# Patient Record
Sex: Male | Born: 1970 | Race: White | Hispanic: No | Marital: Single | Smoking: Current every day smoker
Health system: Southern US, Community
[De-identification: ages and names within clinical notes are randomized; demographics above are authoritative.]

---

## 2003-06-16 ENCOUNTER — Inpatient Hospital Stay (HOSPITAL_COMMUNITY): Admission: AC | Admit: 2003-06-16 | Discharge: 2003-06-26 | Payer: Self-pay

## 2003-08-24 ENCOUNTER — Ambulatory Visit (HOSPITAL_COMMUNITY): Admission: RE | Admit: 2003-08-24 | Discharge: 2003-08-24 | Payer: Self-pay | Admitting: Oral Surgery

## 2004-05-02 ENCOUNTER — Emergency Department (HOSPITAL_COMMUNITY): Admission: EM | Admit: 2004-05-02 | Discharge: 2004-05-02 | Payer: Self-pay | Admitting: Emergency Medicine
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
## 2005-09-19 ENCOUNTER — Emergency Department (HOSPITAL_COMMUNITY): Admission: EM | Admit: 2005-09-19 | Discharge: 2005-09-19 | Payer: Self-pay | Admitting: Emergency Medicine

## 2018-07-29 ENCOUNTER — Other Ambulatory Visit: Payer: Self-pay

## 2018-07-29 ENCOUNTER — Encounter (HOSPITAL_COMMUNITY): Payer: Self-pay

## 2018-07-29 ENCOUNTER — Ambulatory Visit (INDEPENDENT_AMBULATORY_CARE_PROVIDER_SITE_OTHER): Payer: Self-pay

## 2018-07-29 ENCOUNTER — Ambulatory Visit (HOSPITAL_COMMUNITY)
Admission: EM | Admit: 2018-07-29 | Discharge: 2018-07-29 | Disposition: A | Discharge status: Home / Self Care | Payer: Self-pay | Attending: Emergency Medicine | Admitting: Emergency Medicine

## 2018-07-29 DIAGNOSIS — S61212A Laceration without foreign body of right middle finger without damage to nail, initial encounter: Secondary | ICD-10-CM

## 2018-07-29 DIAGNOSIS — W28XXXA Contact with powered lawn mower, initial encounter: Secondary | ICD-10-CM

## 2018-07-29 DIAGNOSIS — S61214A Laceration without foreign body of right ring finger without damage to nail, initial encounter: Secondary | ICD-10-CM

## 2018-07-29 DIAGNOSIS — S61210A Laceration without foreign body of right index finger without damage to nail, initial encounter: Secondary | ICD-10-CM

## 2018-07-29 DIAGNOSIS — Z23 Encounter for immunization: Secondary | ICD-10-CM

## 2018-07-29 MED ORDER — POVIDONE-IODINE 10 % EX SOLN
CUTANEOUS | Status: AC
  Filled 2018-07-29: qty 118

## 2018-07-29 MED ORDER — IBUPROFEN 600 MG PO TABS: 600.0000 mg | ORAL_TABLET | Freq: Four times a day (QID) | ORAL | 0 refills | Status: AC | PRN

## 2018-07-29 MED ORDER — LIDOCAINE HCL 2 % IJ SOLN
INTRAMUSCULAR | Status: AC
  Filled 2018-07-29: qty 20

## 2018-07-29 MED ORDER — HYDROCODONE-ACETAMINOPHEN 5-325 MG PO TABS: 1.0000 | ORAL_TABLET | Freq: Four times a day (QID) | ORAL | 0 refills | Status: AC | PRN

## 2018-07-29 MED ORDER — TETANUS-DIPHTH-ACELL PERTUSSIS 5-2.5-18.5 LF-MCG/0.5 IM SUSP
INTRAMUSCULAR | Status: AC
  Filled 2018-07-29: qty 0.5

## 2018-07-29 MED ORDER — CEPHALEXIN 500 MG PO CAPS: 1000.0000 mg | ORAL_CAPSULE | Freq: Two times a day (BID) | ORAL | 0 refills | Status: AC

## 2018-07-29 MED ORDER — TETANUS-DIPHTH-ACELL PERTUSSIS 5-2.5-18.5 LF-MCG/0.5 IM SUSP
0.5000 mL | Freq: Once | INTRAMUSCULAR | Status: AC
  Administered 2018-07-29: 0.5 mL via INTRAMUSCULAR

## 2018-07-29 NOTE — ED Triage Notes (Signed)
Pt states he cut his fingers on a lawn mower today.  Pt states the lawn mower was falling off of the truck and he went to try and catch it and he he cut 3 fingers.

## 2018-07-29 NOTE — ED Provider Notes (Addendum)
HPI  SUBJECTIVE:  Joshua Wilkins is a right-handed 48 y.o. male who presents with lacerations on his distal right index, middle and ring finger sustained earlier today.  Patient states that he tried to catch a lawnmower and it slammed his hand up against the tailgate.  He denies numbness, tingling, foreign body sensation.  No limitation of movement.  He reports constant throbbing pain.  He tried rinsing the wounds out with water without improvement of symptoms.  No aggravating factors.  He is a smoker.  No history of diabetes.  His tetanus is not up-to-date.  PMD: None.  History reviewed. No pertinent past medical history.  History reviewed. No pertinent surgical history.  History reviewed. No pertinent family history.  Social History   Tobacco Use  . Smoking status: Current Every Day Smoker  . Smokeless tobacco: Never Used  Substance Use Topics  . Alcohol use: Yes  . Drug use: Never    No current facility-administered medications for this encounter.   Current Outpatient Medications:  .  cephALEXin (KEFLEX) 500 MG capsule, Take 2 capsules (1,000 mg total) by mouth 2 (two) times daily for 5 days., Disp: 20 capsule, Rfl: 0 .  HYDROcodone-acetaminophen (NORCO/VICODIN) 5-325 MG tablet, Take 1-2 tablets by mouth every 6 (six) hours as needed for moderate pain or severe pain., Disp: 12 tablet, Rfl: 0 .  ibuprofen (ADVIL) 600 MG tablet, Take 1 tablet (600 mg total) by mouth every 6 (six) hours as needed., Disp: 30 tablet, Rfl: 0  No Known Allergies   ROS  As noted in HPI.   Physical Exam  BP (!) 148/80 (BP Location: Right Arm)   Pulse 79   Temp 98 F (36.7 C)   Resp 18   Wt 72.6 kg   Constitutional: Well developed, well nourished, no acute distress Eyes:  EOMI, conjunctiva normal bilaterally HENT: Normocephalic, atraumatic,mucus membranes moist Respiratory: Normal inspiratory effort Cardiovascular: Normal rate GI: nondistended skin: No rash, skin intact Musculoskeletal:   Right index finger.  1.5 cm linear laceration distal finger with bruising.  Two-point discrimination intact.  FDS/FDP intact.  Cap refill less than 2-second Right middle finger: 2 cm linear laceration at the DIP/distal phalanx. Two-point discrimination intact.  FDS/FDP intact.  Cap refill less than 2 seconds Right ring finger.  1.5 cm linear laceration distal phalanx. Two-point discrimination intact.  FDS/FDP intact.  Cap refill less than 2 seconds     Neurologic: Alert & oriented x 3, no focal neuro deficits Psychiatric: Speech and behavior appropriate   ED Course   Medications  Tdap (BOOSTRIX) injection 0.5 mL (0.5 mLs Intramuscular Given 07/29/18 1308)  povidone-iodine (BETADINE) 10 % external solution (has no administration in time range)  lidocaine (XYLOCAINE) 2 % (with pres) injection (has no administration in time range)  Tdap (BOOSTRIX) 5-2.5-18.5 LF-MCG/0.5 injection (has no administration in time range)    Orders Placed This Encounter  Procedures  . DG Hand Complete Right    Standing Status:   Standing    Number of Occurrences:   1    Order Specific Question:   Reason for Exam (SYMPTOM  OR DIAGNOSIS REQUIRED)    Answer:   laceration index middle ring finger r/.o FB, phalangeal fx  . Apply finger splint static    Middle finger    Standing Status:   Standing    Number of Occurrences:   1    Order Specific Question:   Laterality    Answer:   Right    No  results found for this or any previous visit (from the past 24 hour(s)). Dg Hand Complete Right  Result Date: 07/29/2018 CLINICAL DATA:  Smashed finger between lumbar and car, lacerations to distal aspects of RIGHT index, middle, and ring fingers EXAM: RIGHT HAND - COMPLETE 3+ VIEW COMPARISON:  None FINDINGS: Soft tissue lucency corresponding to lacerations identified at the volar aspects of the distal RIGHT index, middle, and ring fingers. Osseous mineralization normal. Joint spaces preserved. No acute fracture,  dislocation, or bone destruction. No radiopaque foreign bodies identified. IMPRESSION: No acute osseous abnormalities. Electronically Signed   By: Lavonia Dana M.D.   On: 07/29/2018 13:38    ED Clinical Impression  1. Laceration of right index finger without foreign body without damage to nail, initial encounter   2. Laceration of right middle finger without foreign body without damage to nail, initial encounter   3. Laceration of right ring finger without foreign body without damage to nail, initial encounter      ED Assessment/Plan  [place] [facility] database reviewed for this patient, and feel that the risk/benefit ratio today is favorable for proceeding with a prescription for controlled substance.  No opiate prescriptions in 2 years.  Patient declined pain medication here. Updating tetanus. Checking x-ray due to the extensive bruising of his index finger to rule out open fracture.  Reviewed imaging independently.  No fracture, foreign body.  See radiology report for details.  Procedure note: Wounds soaked in soap, water, iodine. Performed a digital block with 5 cc total 2% plain lidocaine with adequate anesthesia. then irrigated with wound wash solution. The wound was then explored with adequate hemostasis.  No foreign body, tendon, neurovascular involvement noted.  Index finger four 5-0 interrupted Prolene sutures with close approximation of wound edges Middle finger six 5-0 interrupted Prolene sutures with close approximation of wound edges.  Splint placed after bacitracin and dressing. Ring finger.  Five 5-0 interrupted Prolene sutures with close approximation of wound edges.  Bacitracin and dressing placed on all lacerations.  Patient tolerated procedure well.  Home with ibuprofen/Tylenol, 5 days of Keflex.  Patient initially declined pain medication but called later requesting some.  Sent in 12 of Norco to pharmacy of patient's choice.  Return here in 10 days for suture removal, sooner  for any signs of infection.  Discussed  imaging, MDM, treatment plan, and plan for follow-up with patient. Discussed sn/sx that should prompt return to the ED. patient agrees with plan.   Meds ordered this encounter  Medications  . Tdap (BOOSTRIX) injection 0.5 mL  . ibuprofen (ADVIL) 600 MG tablet    Sig: Take 1 tablet (600 mg total) by mouth every 6 (six) hours as needed.    Dispense:  30 tablet    Refill:  0  . cephALEXin (KEFLEX) 500 MG capsule    Sig: Take 2 capsules (1,000 mg total) by mouth 2 (two) times daily for 5 days.    Dispense:  20 capsule    Refill:  0  . HYDROcodone-acetaminophen (NORCO/VICODIN) 5-325 MG tablet    Sig: Take 1-2 tablets by mouth every 6 (six) hours as needed for moderate pain or severe pain.    Dispense:  12 tablet    Refill:  0    *This clinic note was created using [employer]. Therefore, there may be occasional mistakes despite careful proofreading.   ?   Melynda Ripple, MD 07/29/18 1626    Melynda Ripple, MD 07/29/18 2122

## 2018-07-29 NOTE — Discharge Instructions (Signed)
Take 600 mg ibuprofen combined with 1 g of Tylenol 3-4 times a day as needed for pain.  Keep this clean and dry for the next 72 hours.  Then you can take the bandage off and let it air dry.  Keep it covered while you are using your hand.  Return here in 10 days for suture removal, sooner for any signs of infection.

## 2018-08-08 ENCOUNTER — Ambulatory Visit (HOSPITAL_COMMUNITY)
Admission: EM | Admit: 2018-08-08 | Discharge: 2018-08-08 | Disposition: A | Discharge status: Home / Self Care | Payer: Self-pay

## 2018-08-08 ENCOUNTER — Other Ambulatory Visit: Payer: Self-pay

## 2018-08-08 ENCOUNTER — Encounter (HOSPITAL_COMMUNITY): Payer: Self-pay

## 2018-08-08 DIAGNOSIS — Z4802 Encounter for removal of sutures: Secondary | ICD-10-CM

## 2018-08-08 NOTE — ED Triage Notes (Signed)
Pt presents today for suture removal from three fingers on right hand. 15 sutures removed, wounds healing well, no bleeding noted. Pt verbalizes understanding of wound care.

## 2019-09-03 ENCOUNTER — Ambulatory Visit: Payer: Self-pay

## 2022-04-15 ENCOUNTER — Emergency Department (HOSPITAL_COMMUNITY)
Admission: EM | Admit: 2022-04-15 | Discharge: 2022-04-15 | Disposition: A | Discharge status: Other Acute Inpatient Hospital | Payer: Medicaid Other | Attending: Emergency Medicine | Admitting: Emergency Medicine

## 2022-04-15 ENCOUNTER — Emergency Department (HOSPITAL_COMMUNITY): Payer: Medicaid Other

## 2022-04-15 ENCOUNTER — Encounter (HOSPITAL_COMMUNITY): Payer: Self-pay | Admitting: Emergency Medicine

## 2022-04-15 ENCOUNTER — Other Ambulatory Visit: Payer: Self-pay

## 2022-04-15 DIAGNOSIS — S0183XA Puncture wound without foreign body of other part of head, initial encounter: Secondary | ICD-10-CM

## 2022-04-15 DIAGNOSIS — T1491XA Suicide attempt, initial encounter: Secondary | ICD-10-CM

## 2022-04-15 DIAGNOSIS — S02600B Fracture of unspecified part of body of mandible, initial encounter for open fracture: Secondary | ICD-10-CM | POA: Insufficient documentation

## 2022-04-15 DIAGNOSIS — H748X2 Other specified disorders of left middle ear and mastoid: Secondary | ICD-10-CM | POA: Diagnosis not present

## 2022-04-15 DIAGNOSIS — F17210 Nicotine dependence, cigarettes, uncomplicated: Secondary | ICD-10-CM | POA: Diagnosis not present

## 2022-04-15 DIAGNOSIS — E876 Hypokalemia: Secondary | ICD-10-CM | POA: Insufficient documentation

## 2022-04-15 DIAGNOSIS — S01439A Puncture wound without foreign body of unspecified cheek and temporomandibular area, initial encounter: Secondary | ICD-10-CM | POA: Diagnosis not present

## 2022-04-15 DIAGNOSIS — X749XXA Intentional self-harm by unspecified firearm discharge, initial encounter: Secondary | ICD-10-CM | POA: Diagnosis not present

## 2022-04-15 LAB — CBC
HCT: 28.6 % — ABNORMAL LOW (ref 39.0–52.0)
HCT: 28.1 % — ABNORMAL LOW (ref 39.0–52.0)
Hemoglobin: 9.7 g/dL — ABNORMAL LOW (ref 13.0–17.0)
Hemoglobin: 9.6 g/dL — ABNORMAL LOW (ref 13.0–17.0)
MCH: 32.3 pg (ref 26.0–34.0)
MCH: 32.3 pg (ref 26.0–34.0)
MCHC: 33.9 g/dL (ref 30.0–36.0)
MCHC: 34.2 g/dL (ref 30.0–36.0)
MCV: 95.3 fL (ref 80.0–100.0)
MCV: 94.6 fL (ref 80.0–100.0)
Platelets: 377 10*3/uL (ref 150–400)
Platelets: 259 10*3/uL (ref 150–400)
RBC: 3 MIL/uL — ABNORMAL LOW (ref 4.22–5.81)
RBC: 2.97 MIL/uL — ABNORMAL LOW (ref 4.22–5.81)
RDW: 13 % (ref 11.5–15.5)
RDW: 13 % (ref 11.5–15.5)
WBC: 19.9 10*3/uL — ABNORMAL HIGH (ref 4.0–10.5)
WBC: 20.2 10*3/uL — ABNORMAL HIGH (ref 4.0–10.5)
nRBC: 0 % (ref 0.0–0.2)
nRBC: 0 % (ref 0.0–0.2)

## 2022-04-15 LAB — COMPREHENSIVE METABOLIC PANEL
ALT: 37 U/L (ref 0–44)
AST: 44 U/L — ABNORMAL HIGH (ref 15–41)
Albumin: 3.8 g/dL (ref 3.5–5.0)
Alkaline Phosphatase: 69 U/L (ref 38–126)
Anion gap: 12 (ref 5–15)
BUN: 10 mg/dL (ref 6–20)
CO2: 24 mmol/L (ref 22–32)
Calcium: 8.6 mg/dL — ABNORMAL LOW (ref 8.9–10.3)
Chloride: 97 mmol/L — ABNORMAL LOW (ref 98–111)
Creatinine, Ser: 1.22 mg/dL (ref 0.61–1.24)
GFR, Estimated: >60 mL/min (ref >60)
Glucose, Bld: 179 mg/dL — ABNORMAL HIGH (ref 70–99)
Potassium: 3.3 mmol/L — ABNORMAL LOW (ref 3.5–5.1)
Sodium: 133 mmol/L — ABNORMAL LOW (ref 135–145)
Total Bilirubin: 0.9 mg/dL (ref 0.3–1.2)
Total Protein: 6.5 g/dL (ref 6.5–8.1)

## 2022-04-15 LAB — I-STAT CHEM 8, ED
BUN: 10 mg/dL (ref 6–20)
Calcium, Ion: 1.08 mmol/L — ABNORMAL LOW (ref 1.15–1.40)
Chloride: 97 mmol/L — ABNORMAL LOW (ref 98–111)
Creatinine, Ser: 1.1 mg/dL (ref 0.61–1.24)
Glucose, Bld: 183 mg/dL — ABNORMAL HIGH (ref 70–99)
HCT: 30 % — ABNORMAL LOW (ref 39.0–52.0)
Hemoglobin: 10.2 g/dL — ABNORMAL LOW (ref 13.0–17.0)
Potassium: 3.3 mmol/L — ABNORMAL LOW (ref 3.5–5.1)
Sodium: 135 mmol/L (ref 135–145)
TCO2: 23 mmol/L (ref 22–32)

## 2022-04-15 LAB — LACTIC ACID, PLASMA: Lactic Acid, Venous: 4.3 mmol/L (ref 0.5–1.9)

## 2022-04-15 LAB — SAMPLE TO BLOOD BANK

## 2022-04-15 LAB — PROTIME-INR
INR: 1.2 (ref 0.8–1.2)
Prothrombin Time: 14.7 seconds (ref 11.4–15.2)

## 2022-04-15 LAB — ETHANOL: Alcohol, Ethyl (B): <10 mg/dL (ref <10)

## 2022-04-15 MED ORDER — CEFAZOLIN SODIUM-DEXTROSE 2-4 GM/100ML-% IV SOLN
2.0000 g | INTRAVENOUS | Status: AC
  Administered 2022-04-15: 2 g via INTRAVENOUS

## 2022-04-15 MED ORDER — FENTANYL CITRATE PF 50 MCG/ML IJ SOSY
PREFILLED_SYRINGE | INTRAMUSCULAR | Status: AC
  Administered 2022-04-15: 25 ug
  Filled 2022-04-15: qty 1
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
## 2022-04-15 NOTE — ED Notes (Signed)
[facility] called for transport to [facility]  
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
## 2022-04-16 ENCOUNTER — Encounter (HOSPITAL_COMMUNITY): Payer: Self-pay

## 2022-07-03 ENCOUNTER — Emergency Department (HOSPITAL_BASED_OUTPATIENT_CLINIC_OR_DEPARTMENT_OTHER)
Admission: EM | Admit: 2022-07-03 | Discharge: 2022-07-03 | Disposition: A | Discharge status: Home / Self Care | Payer: Medicaid Other | Attending: Emergency Medicine | Admitting: Emergency Medicine

## 2022-07-03 ENCOUNTER — Emergency Department (HOSPITAL_BASED_OUTPATIENT_CLINIC_OR_DEPARTMENT_OTHER): Payer: Medicaid Other

## 2022-07-03 ENCOUNTER — Encounter (HOSPITAL_BASED_OUTPATIENT_CLINIC_OR_DEPARTMENT_OTHER): Payer: Self-pay

## 2022-07-03 ENCOUNTER — Other Ambulatory Visit: Payer: Self-pay

## 2022-07-03 DIAGNOSIS — K9423 Gastrostomy malfunction: Secondary | ICD-10-CM | POA: Insufficient documentation

## 2022-07-03 NOTE — ED Triage Notes (Signed)
Patient here POV from Home.  Notes a Leaking Feeding Tube. Has had this one present for 2 Months and noted to be leaking after cleaning less than an hour ago.  NAD Noted during Triage. A&Ox4. GCS 15. Ambulatory.

## 2022-07-03 NOTE — ED Notes (Signed)
XR at bedside
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
## 2022-07-03 NOTE — Discharge Instructions (Signed)
Your G-tube balloon was inflated and now it is not leaking  Your tubing will need to be replaced and you need to talk to your GI doctor about replacing the tubing  Follow-up with your doctor  Return to ER if you have worsening pain or G-tube leaking or coming out

## 2022-07-03 NOTE — ED Notes (Signed)
Reviewed AVS with patient, patient expressed understanding of directions, denies further questions at this time. 

## 2022-09-13 ENCOUNTER — Other Ambulatory Visit (HOSPITAL_BASED_OUTPATIENT_CLINIC_OR_DEPARTMENT_OTHER): Payer: Self-pay

## 2022-09-13 ENCOUNTER — Emergency Department (HOSPITAL_BASED_OUTPATIENT_CLINIC_OR_DEPARTMENT_OTHER): Payer: Medicaid Other

## 2022-09-13 ENCOUNTER — Encounter (HOSPITAL_BASED_OUTPATIENT_CLINIC_OR_DEPARTMENT_OTHER): Payer: Self-pay

## 2022-09-13 ENCOUNTER — Observation Stay (HOSPITAL_BASED_OUTPATIENT_CLINIC_OR_DEPARTMENT_OTHER)
Admission: EM | Admit: 2022-09-13 | Discharge: 2022-09-16 | Disposition: A | Discharge status: Home / Self Care | Payer: Medicaid Other | Attending: Internal Medicine | Admitting: Internal Medicine

## 2022-09-13 ENCOUNTER — Other Ambulatory Visit: Payer: Self-pay

## 2022-09-13 DIAGNOSIS — F17210 Nicotine dependence, cigarettes, uncomplicated: Secondary | ICD-10-CM | POA: Diagnosis present

## 2022-09-13 DIAGNOSIS — E860 Dehydration: Secondary | ICD-10-CM | POA: Diagnosis present

## 2022-09-13 DIAGNOSIS — I700 Atherosclerosis of aorta: Secondary | ICD-10-CM | POA: Diagnosis not present

## 2022-09-13 DIAGNOSIS — Z9151 Personal history of suicidal behavior: Secondary | ICD-10-CM | POA: Diagnosis not present

## 2022-09-13 DIAGNOSIS — Z1152 Encounter for screening for COVID-19: Secondary | ICD-10-CM

## 2022-09-13 DIAGNOSIS — D509 Iron deficiency anemia, unspecified: Secondary | ICD-10-CM | POA: Diagnosis not present

## 2022-09-13 DIAGNOSIS — Z931 Gastrostomy status: Secondary | ICD-10-CM | POA: Diagnosis not present

## 2022-09-13 DIAGNOSIS — N179 Acute kidney failure, unspecified: Secondary | ICD-10-CM | POA: Diagnosis not present

## 2022-09-13 DIAGNOSIS — K5900 Constipation, unspecified: Secondary | ICD-10-CM | POA: Diagnosis not present

## 2022-09-13 DIAGNOSIS — D72829 Elevated white blood cell count, unspecified: Secondary | ICD-10-CM | POA: Diagnosis not present

## 2022-09-13 DIAGNOSIS — R1110 Vomiting, unspecified: Secondary | ICD-10-CM

## 2022-09-13 DIAGNOSIS — D649 Anemia, unspecified: Secondary | ICD-10-CM

## 2022-09-13 DIAGNOSIS — R112 Nausea with vomiting, unspecified: Secondary | ICD-10-CM | POA: Diagnosis not present

## 2022-09-13 DIAGNOSIS — E861 Hypovolemia: Secondary | ICD-10-CM | POA: Diagnosis present

## 2022-09-13 DIAGNOSIS — Z9152 Personal history of nonsuicidal self-harm: Secondary | ICD-10-CM | POA: Insufficient documentation

## 2022-09-13 LAB — COMPREHENSIVE METABOLIC PANEL
ALT: 18 U/L (ref 0–44)
ALT: 20 U/L (ref 0–44)
AST: 12 U/L — ABNORMAL LOW (ref 15–41)
AST: 13 U/L — ABNORMAL LOW (ref 15–41)
Albumin: 4.9 g/dL (ref 3.5–5.0)
Albumin: 4.2 g/dL (ref 3.5–5.0)
Alkaline Phosphatase: 67 U/L (ref 38–126)
Alkaline Phosphatase: 62 U/L (ref 38–126)
Anion gap: 15 (ref 5–15)
Anion gap: 12 (ref 5–15)
BUN: 55 mg/dL — ABNORMAL HIGH (ref 6–20)
BUN: 52 mg/dL — ABNORMAL HIGH (ref 6–20)
CO2: 26 mmol/L (ref 22–32)
CO2: 23 mmol/L (ref 22–32)
Calcium: 10.6 mg/dL — ABNORMAL HIGH (ref 8.9–10.3)
Calcium: 9.4 mg/dL (ref 8.9–10.3)
Chloride: 95 mmol/L — ABNORMAL LOW (ref 98–111)
Chloride: 101 mmol/L (ref 98–111)
Creatinine, Ser: 2.77 mg/dL — ABNORMAL HIGH (ref 0.61–1.24)
Creatinine, Ser: 2.22 mg/dL — ABNORMAL HIGH (ref 0.61–1.24)
GFR, Estimated: 27 mL/min — ABNORMAL LOW (ref >60)
GFR, Estimated: 35 mL/min — ABNORMAL LOW (ref >60)
Glucose, Bld: 105 mg/dL — ABNORMAL HIGH (ref 70–99)
Glucose, Bld: 100 mg/dL — ABNORMAL HIGH (ref 70–99)
Potassium: 3.3 mmol/L — ABNORMAL LOW (ref 3.5–5.1)
Potassium: 3.5 mmol/L (ref 3.5–5.1)
Sodium: 136 mmol/L (ref 135–145)
Sodium: 136 mmol/L (ref 135–145)
Total Bilirubin: 0.7 mg/dL (ref 0.3–1.2)
Total Bilirubin: 0.9 mg/dL (ref 0.3–1.2)
Total Protein: 8.1 g/dL (ref 6.5–8.1)
Total Protein: 7.6 g/dL (ref 6.5–8.1)

## 2022-09-13 LAB — LIPASE, BLOOD: Lipase: 24 U/L (ref 11–51)

## 2022-09-13 LAB — URINALYSIS, COMPLETE (UACMP) WITH MICROSCOPIC
Bacteria, UA: NONE SEEN
Bilirubin Urine: NEGATIVE
Glucose, UA: NEGATIVE mg/dL
Hgb urine dipstick: NEGATIVE
Ketones, ur: 20 mg/dL — AB
Leukocytes,Ua: NEGATIVE
Nitrite: NEGATIVE
Protein, ur: 30 mg/dL — AB
Specific Gravity, Urine: 1.018 (ref 1.005–1.030)
pH: 5 (ref 5.0–8.0)

## 2022-09-13 LAB — CBC
HCT: 26.8 % — ABNORMAL LOW (ref 39.0–52.0)
HCT: 25.2 % — ABNORMAL LOW (ref 39.0–52.0)
Hemoglobin: 9.2 g/dL — ABNORMAL LOW (ref 13.0–17.0)
Hemoglobin: 8.3 g/dL — ABNORMAL LOW (ref 13.0–17.0)
MCH: 29.8 pg (ref 26.0–34.0)
MCH: 30.1 pg (ref 26.0–34.0)
MCHC: 34.3 g/dL (ref 30.0–36.0)
MCHC: 32.9 g/dL (ref 30.0–36.0)
MCV: 86.7 fL (ref 80.0–100.0)
MCV: 91.3 fL (ref 80.0–100.0)
Platelets: 516 10*3/uL — ABNORMAL HIGH (ref 150–400)
Platelets: 477 10*3/uL — ABNORMAL HIGH (ref 150–400)
RBC: 3.09 MIL/uL — ABNORMAL LOW (ref 4.22–5.81)
RBC: 2.76 MIL/uL — ABNORMAL LOW (ref 4.22–5.81)
RDW: 16.6 % — ABNORMAL HIGH (ref 11.5–15.5)
RDW: 16.8 % — ABNORMAL HIGH (ref 11.5–15.5)
WBC: 13.5 10*3/uL — ABNORMAL HIGH (ref 4.0–10.5)
WBC: 13.6 10*3/uL — ABNORMAL HIGH (ref 4.0–10.5)
nRBC: 0 % (ref 0.0–0.2)
nRBC: 0 % (ref 0.0–0.2)

## 2022-09-13 LAB — SODIUM, URINE, RANDOM: Sodium, Ur: 18 mmol/L

## 2022-09-13 LAB — CREATININE, SERUM
Creatinine, Ser: 2.18 mg/dL — ABNORMAL HIGH (ref 0.61–1.24)
GFR, Estimated: 36 mL/min — ABNORMAL LOW (ref >60)

## 2022-09-13 LAB — SARS CORONAVIRUS 2 BY RT PCR: SARS Coronavirus 2 by RT PCR: NEGATIVE

## 2022-09-13 LAB — IRON AND TIBC
Iron: 66 ug/dL (ref 45–182)
Saturation Ratios: 20 % (ref 17.9–39.5)
TIBC: 336 ug/dL (ref 250–450)
UIBC: 270 ug/dL

## 2022-09-13 LAB — FERRITIN: Ferritin: 167 ng/mL (ref 24–336)

## 2022-09-13 LAB — VITAMIN B12: Vitamin B-12: 364 pg/mL (ref 180–914)

## 2022-09-13 LAB — RETICULOCYTES
Immature Retic Fract: 7.5 % (ref 2.3–15.9)
RBC.: 2.76 MIL/uL — ABNORMAL LOW (ref 4.22–5.81)
Retic Count, Absolute: 43.1 10*3/uL (ref 19.0–186.0)
Retic Ct Pct: 1.6 % (ref 0.4–3.1)

## 2022-09-13 LAB — PHOSPHORUS: Phosphorus: 2.4 mg/dL — ABNORMAL LOW (ref 2.5–4.6)

## 2022-09-13 LAB — CREATININE, URINE, RANDOM: Creatinine, Urine: 166 mg/dL

## 2022-09-13 LAB — LACTATE DEHYDROGENASE: LDH: 89 U/L — ABNORMAL LOW (ref 98–192)

## 2022-09-13 LAB — FOLATE: Folate: 10.3 ng/mL (ref >5.9)

## 2022-09-13 MED ORDER — ACETAMINOPHEN 650 MG RE SUPP: 650.0000 mg | Freq: Four times a day (QID) | RECTAL | Status: DC | PRN

## 2022-09-13 MED ORDER — ENOXAPARIN SODIUM 40 MG/0.4ML IJ SOSY
40.0000 mg | PREFILLED_SYRINGE | INTRAMUSCULAR | Status: DC
  Administered 2022-09-14 – 2022-09-15 (×2): 40 mg via SUBCUTANEOUS
  Filled 2022-09-13 (×2): qty 0.4

## 2022-09-13 MED ORDER — ACETAMINOPHEN 325 MG PO TABS: 650.0000 mg | ORAL_TABLET | Freq: Four times a day (QID) | ORAL | Status: DC | PRN

## 2022-09-13 MED ORDER — SODIUM CHLORIDE 0.9 % IV BOLUS
1000.0000 mL | Freq: Once | INTRAVENOUS | Status: AC
  Administered 2022-09-13: 1000 mL via INTRAVENOUS

## 2022-09-13 MED ORDER — POTASSIUM CHLORIDE 20 MEQ PO PACK
40.0000 meq | PACK | Freq: Once | ORAL | Status: AC
  Administered 2022-09-13: 40 meq via ORAL
  Filled 2022-09-13: qty 2

## 2022-09-13 MED ORDER — POLYETHYLENE GLYCOL 3350 17 G PO PACK: 17.0000 g | PACK | Freq: Every day | ORAL | Status: DC | PRN

## 2022-09-13 MED ORDER — SODIUM CHLORIDE 0.9 % IV SOLN: INTRAVENOUS | Status: DC

## 2022-09-13 MED ORDER — SODIUM CHLORIDE 0.9 % IV SOLN
12.5000 mg | Freq: Four times a day (QID) | INTRAVENOUS | Status: DC | PRN
  Administered 2022-09-14: 12.5 mg via INTRAVENOUS
  Filled 2022-09-13: qty 12.5
  Filled 2022-09-13 (×2): qty 0.5

## 2022-09-13 MED ORDER — SODIUM CHLORIDE 0.9% FLUSH
3.0000 mL | Freq: Two times a day (BID) | INTRAVENOUS | Status: DC
  Administered 2022-09-13 – 2022-09-15 (×2): 3 mL via INTRAVENOUS

## 2022-09-13 MED ORDER — SENNOSIDES-DOCUSATE SODIUM 8.6-50 MG PO TABS
2.0000 | ORAL_TABLET | Freq: Two times a day (BID) | ORAL | Status: DC
  Administered 2022-09-13 – 2022-09-15 (×5): 2 via ORAL
  Filled 2022-09-13 (×5): qty 2

## 2022-09-13 MED ORDER — ONDANSETRON 4 MG PO TBDP
4.0000 mg | ORAL_TABLET | Freq: Once | ORAL | Status: AC | PRN
  Administered 2022-09-13: 4 mg via ORAL
  Filled 2022-09-13: qty 1
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
## 2022-09-13 NOTE — ED Notes (Signed)
Report given to [facility] 

## 2022-09-13 NOTE — Assessment & Plan Note (Signed)
This seems to be new, incidental.  Check ionized calcium, vitamin D, phosphate, PTH

## 2022-09-13 NOTE — ED Notes (Signed)
Report given the RN on the Floor.

## 2022-09-13 NOTE — ED Notes (Signed)
Joshua Wilkins with cl called for transport

## 2022-09-13 NOTE — Plan of Care (Signed)

## 2022-09-13 NOTE — ED Notes (Signed)
Advised pt of need for urine sample, pt stated he is unable to void at this time.

## 2022-09-13 NOTE — Assessment & Plan Note (Signed)
Check urinalysis, sodium and creatinine.  However it is noted that the patient is having vomiting last couple of weeks and is hypercalcemic, this is therefore likely prerenal.  Patient has been started on treatment with saline infusion.  We will trend the BMP

## 2022-09-13 NOTE — Assessment & Plan Note (Signed)
Anemia panel will be ordered including LDH as well as electrophoresis

## 2022-09-13 NOTE — ED Triage Notes (Signed)
Pt c/o emesis x2wks. Denies associated abd pain, states there are no additional symptoms
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
## 2022-09-14 DIAGNOSIS — N179 Acute kidney failure, unspecified: Secondary | ICD-10-CM | POA: Diagnosis not present

## 2022-09-14 LAB — HIV ANTIBODY (ROUTINE TESTING W REFLEX): HIV Screen 4th Generation wRfx: NONREACTIVE

## 2022-09-14 LAB — PROTIME-INR
INR: 1.2 (ref 0.8–1.2)
Prothrombin Time: 15.5 s — ABNORMAL HIGH (ref 11.4–15.2)

## 2022-09-14 LAB — BASIC METABOLIC PANEL
Anion gap: 9 (ref 5–15)
BUN: 48 mg/dL — ABNORMAL HIGH (ref 6–20)
CO2: 23 mmol/L (ref 22–32)
Calcium: 9.2 mg/dL (ref 8.9–10.3)
Chloride: 106 mmol/L (ref 98–111)
Creatinine, Ser: 1.8 mg/dL — ABNORMAL HIGH (ref 0.61–1.24)
GFR, Estimated: 45 mL/min — ABNORMAL LOW (ref >60)
Glucose, Bld: 97 mg/dL (ref 70–99)
Potassium: 3.7 mmol/L (ref 3.5–5.1)
Sodium: 138 mmol/L (ref 135–145)

## 2022-09-14 LAB — CBC
HCT: 23.8 % — ABNORMAL LOW (ref 39.0–52.0)
Hemoglobin: 8 g/dL — ABNORMAL LOW (ref 13.0–17.0)
MCH: 30.4 pg (ref 26.0–34.0)
MCHC: 33.6 g/dL (ref 30.0–36.0)
MCV: 90.5 fL (ref 80.0–100.0)
Platelets: 469 10*3/uL — ABNORMAL HIGH (ref 150–400)
RBC: 2.63 MIL/uL — ABNORMAL LOW (ref 4.22–5.81)
RDW: 16.8 % — ABNORMAL HIGH (ref 11.5–15.5)
WBC: 11 10*3/uL — ABNORMAL HIGH (ref 4.0–10.5)
nRBC: 0 % (ref 0.0–0.2)

## 2022-09-14 LAB — VITAMIN D 25 HYDROXY (VIT D DEFICIENCY, FRACTURES): Vit D, 25-Hydroxy: 65.25 ng/mL (ref 30–100)

## 2022-09-14 LAB — APTT: aPTT: 32 s (ref 24–36)

## 2022-09-14 MED ORDER — CYANOCOBALAMIN 1000 MCG/ML IJ SOLN
1000.0000 ug | Freq: Every day | INTRAMUSCULAR | Status: DC
  Administered 2022-09-14 – 2022-09-15 (×2): 1000 ug via SUBCUTANEOUS
  Filled 2022-09-14 (×3): qty 1

## 2022-09-14 MED ORDER — SORBITOL 70 % SOLN
30.0000 mL | Freq: Once | Status: AC
  Administered 2022-09-14: 30 mL via ORAL
  Filled 2022-09-14: qty 30

## 2022-09-14 MED ORDER — POTASSIUM CHLORIDE 20 MEQ PO PACK
40.0000 meq | PACK | Freq: Once | ORAL | Status: AC
  Administered 2022-09-14: 40 meq via ORAL
  Filled 2022-09-14: qty 2

## 2022-09-14 MED ORDER — POLYETHYLENE GLYCOL 3350 17 G PO PACK
17.0000 g | PACK | Freq: Two times a day (BID) | ORAL | Status: AC
  Administered 2022-09-14 – 2022-09-15 (×4): 17 g via ORAL
  Filled 2022-09-14 (×4): qty 1

## 2022-09-14 MED ORDER — SODIUM CHLORIDE 0.9 % IV SOLN: INTRAVENOUS | Status: AC

## 2022-09-14 NOTE — Progress Notes (Addendum)
[facility] PROGRESS NOTE    Progress Note  Joshua Wilkins  NWG:[id number] DOB: 10-Mar-1970 DOA: 09/13/2022 PCP: Oneita Hurt, No     Brief Narrative:   Joshua Wilkins is an 52 y.o. male past medical history significant provide for self-inflicted wound to the face and a history of G-tube reports he was in his usual state until 2 weeks ago when he started having nausea and vomiting nonbloody.  Came into the ED found to be in acute kidney injury   Assessment/Plan:   AKI (acute kidney injury) (HCC) Likely prerenal azotemia in the setting of decreased oral intake and nausea and vomiting. Will continue aggressive IV fluid hydration his creatinine is improving. With a baseline creatinine of 1.  Hypercalcemia: Incidental likely due to hypovolemia.  Leukocytosis: Likely reactive is improving has remained afebrile continue to hold antibiotics.  Constipation: MiraLAX p.o. twice daily and sorbitol  Normocytic anemia: With an MCV of 90 RDW of 16 B12 362. B12 was borderline low start repletion.  Nausea/Vomiting: Now improved    DVT prophylaxis: lovenox Family Communication:none Status is: Inpatient Remains inpatient appropriate because: Acute kidney injury    Code Status:     Code Status Orders  (From admission, onward)           Start     Ordered   09/13/22 1915  Full code  Continuous       Question:  By:  Answer:  Other   09/13/22 1915           Code Status History     This patient has a current code status but no historical code status.         IV Access:   Peripheral IV   Procedures and diagnostic studies:   CT ABDOMEN PELVIS WO CONTRAST  Result Date: 09/13/2022 CLINICAL DATA:  Two-week history of emesis EXAM: CT ABDOMEN AND PELVIS WITHOUT CONTRAST TECHNIQUE: Multidetector CT imaging of the abdomen and pelvis was performed following the standard protocol without IV contrast. RADIATION DOSE REDUCTION: This exam was performed according to the  departmental dose-optimization program which includes automated exposure control, adjustment of the mA and/or kV according to patient size and/or use of iterative reconstruction technique. COMPARISON:  CT abdomen and pelvis dated 06/16/2003 FINDINGS: Lower chest: No focal consolidation or pulmonary nodule in the lung bases. No pleural effusion or pneumothorax demonstrated. Partially imaged heart size is normal. Hepatobiliary: No focal hepatic lesions. No intra or extrahepatic biliary ductal dilation. Normal gallbladder. Pancreas: No focal lesions or main ductal dilation. Spleen: Normal in size without focal abnormality. Adrenals/Urinary Tract: No adrenal nodules. Duplex right kidney. No suspicious renal mass, calculi or hydronephrosis. No focal bladder wall thickening. Stomach/Bowel: Normal appearance of the stomach. No evidence of bowel wall thickening, distention, or inflammatory changes. Large volume stool within the rectum with moderate volume stool throughout the remainder of the colon. Normal appendix. Vascular/Lymphatic: Aortic atherosclerosis. No enlarged abdominal or pelvic lymph nodes. Reproductive: Prostate is unremarkable. Other: No free fluid, fluid collection, or free air. Musculoskeletal: No acute or abnormal lytic or blastic osseous lesions. Degenerative changes of the lumbar spine at L4-5 with grade 1 anterolisthesis. IMPRESSION: 1. No acute abnormality in the abdomen or pelvis. 2. Large volume stool within the rectum with moderate volume stool throughout the remainder of the colon. 3.  Aortic Atherosclerosis (ICD10-I70.0). Electronically Signed   By: Agustin Cree M.D.   On: 09/13/2022 15:29     Medical Consultants:   None.   Subjective:  Joshua Wilkins relates his nausea is resolved, would like to try diet.  Has not had a bowel movement in over several days  Objective:    Vitals:   09/13/22 2004 09/13/22 2250 09/14/22 0200 09/14/22 0620  BP:  119/81 111/84 113/68  Pulse:  93 90 90   Resp:  16 14 20   Temp:  98.8 F (37.1 C) 98.6 F (37 C) 98.2 F (36.8 C)  TempSrc:  Oral Oral   SpO2:  99% 98% 99%  Weight: 63.3 kg     Height: 6' (1.829 m)      SpO2: 99 %   Intake/Output Summary (Last 24 hours) at 09/14/2022 0731 Last data filed at 09/13/2022 2137 Gross per 24 hour  Intake 1010 ml  Output --  Net 1010 ml   Filed Weights   09/13/22 2004  Weight: 63.3 kg    Exam: General exam: In no acute distress. Respiratory system: Good air movement and clear to auscultation. Cardiovascular system: S1 & S2 heard, RRR. No JVD. Gastrointestinal system: Abdomen is nondistended, soft and nontender.  Extremities: No pedal edema. Skin: No rashes, lesions or ulcers Psychiatry: Judgement and insight appear normal. Mood & affect appropriate.    Data Reviewed:    Labs: Basic Metabolic Panel: Recent Labs  Lab 09/13/22 1354 09/13/22 1934 09/13/22 2150 09/14/22 0317  NA 136  --  136 138  K 3.3*  --  3.5 3.7  CL 95*  --  101 106  CO2 26  --  23 23  GLUCOSE 105*  --  100* 97  BUN 55*  --  52* 48*  CREATININE 2.77* 2.18* 2.22* 1.80*  CALCIUM 10.6*  --  9.4 9.2  PHOS  --   --  2.4*  --    GFR Estimated Creatinine Clearance: 44 mL/min (A) (by C-G formula based on SCr of 1.8 mg/dL (H)). Liver Function Tests: Recent Labs  Lab 09/13/22 1354 09/13/22 2150  AST 12* 13*  ALT 18 20  ALKPHOS 67 62  BILITOT 0.7 0.9  PROT 8.1 7.6  ALBUMIN 4.9 4.2   Recent Labs  Lab 09/13/22 1354  LIPASE 24   No results for input(s): "AMMONIA" in the last 168 hours. Coagulation profile Recent Labs  Lab 09/14/22 0317  INR 1.2   COVID-19 Labs  Recent Labs    09/13/22 2150  FERRITIN 167  LDH 89*    Lab Results  Component Value Date   SARSCOV2NAA NEGATIVE 09/13/2022    CBC: Recent Labs  Lab 09/13/22 1354 09/13/22 1934 09/14/22 0317  WBC 13.5* 13.6* 11.0*  HGB 9.2* 8.3* 8.0*  HCT 26.8* 25.2* 23.8*  MCV 86.7 91.3 90.5  PLT 516* 477* 469*   Cardiac  Enzymes: No results for input(s): "CKTOTAL", "CKMB", "CKMBINDEX", "TROPONINI" in the last 168 hours. BNP (last 3 results) No results for input(s): "PROBNP" in the last 8760 hours. CBG: No results for input(s): "GLUCAP" in the last 168 hours. D-Dimer: No results for input(s): "DDIMER" in the last 72 hours. Hgb A1c: No results for input(s): "HGBA1C" in the last 72 hours. Lipid Profile: No results for input(s): "CHOL", "HDL", "LDLCALC", "TRIG", "CHOLHDL", "LDLDIRECT" in the last 72 hours. Thyroid function studies: No results for input(s): "TSH", "T4TOTAL", "T3FREE", "THYROIDAB" in the last 72 hours.  Invalid input(s): "FREET3" Anemia work up: Recent Labs    09/13/22 2150  VITAMINB12 364  FOLATE 10.3  FERRITIN 167  TIBC 336  IRON 66  RETICCTPCT 1.6   Sepsis Labs: Recent Labs  Lab 09/13/22 1354 09/13/22 1934 09/14/22 0317  WBC 13.5* 13.6* 11.0*   Microbiology Recent Results (from the past 240 hour(s))  SARS Coronavirus 2 by RT PCR (hospital order, performed in [facility] hospital lab) *cepheid single result test* Anterior Nasal Swab     Status: None   Collection Time: 09/13/22  9:52 PM   Specimen: Anterior Nasal Swab  Result Value Ref Range Status   SARS Coronavirus 2 by RT PCR NEGATIVE NEGATIVE Final    Comment: (NOTE) SARS-CoV-2 target nucleic acids are NOT DETECTED.  The SARS-CoV-2 RNA is generally detectable in upper and lower respiratory specimens during the acute phase of infection. The lowest concentration of SARS-CoV-2 viral copies this assay can detect is 250 copies / mL. A negative result does not preclude SARS-CoV-2 infection and should not be used as the sole basis for treatment or other patient management decisions.  A negative result may occur with improper specimen collection / handling, submission of specimen other than nasopharyngeal swab, presence of viral mutation(s) within the areas targeted by this assay, and inadequate number of viral  copies (<250 copies / mL). A negative result must be combined with clinical observations, patient history, and epidemiological information.  Fact Sheet for Patients:   RoadLapTop.co.za  Fact Sheet for Healthcare Providers: http://kim-miller.com/  This test is not yet approved or  cleared by the [place] FDA and has been authorized for detection and/or diagnosis of SARS-CoV-2 by FDA under an Emergency Use Authorization (EUA).  This EUA will remain in effect (meaning this test can be used) for the duration of the COVID-19 declaration under Section 564(b)(1) of the Act, 21 U.S.C. section 360bbb-3(b)(1), unless the authorization is terminated or revoked sooner.  Performed at [facility], [address] [place], [place] [address]      Medications:    enoxaparin (LOVENOX) injection  40 mg Subcutaneous Q24H   senna-docusate  2 tablet Oral BID   sodium chloride flush  3 mL Intravenous Q12H   Continuous Infusions:  sodium chloride 100 mL/hr at 09/14/22 6045   promethazine (PHENERGAN) injection (IM or IVPB)        LOS: 1 day   Marinda Elk  [facility]  09/14/2022, 7:31 AM

## 2022-09-14 NOTE — Plan of Care (Signed)

## 2022-09-15 DIAGNOSIS — N179 Acute kidney failure, unspecified: Secondary | ICD-10-CM | POA: Diagnosis not present

## 2022-09-15 LAB — BASIC METABOLIC PANEL
Anion gap: 6 (ref 5–15)
BUN: 24 mg/dL — ABNORMAL HIGH (ref 6–20)
CO2: 23 mmol/L (ref 22–32)
Calcium: 8.7 mg/dL — ABNORMAL LOW (ref 8.9–10.3)
Chloride: 111 mmol/L (ref 98–111)
Creatinine, Ser: 0.88 mg/dL (ref 0.61–1.24)
GFR, Estimated: >60 mL/min (ref >60)
Glucose, Bld: 102 mg/dL — ABNORMAL HIGH (ref 70–99)
Potassium: 3.4 mmol/L — ABNORMAL LOW (ref 3.5–5.1)
Sodium: 140 mmol/L (ref 135–145)

## 2022-09-15 LAB — URINE DRUGS OF ABUSE SCREEN W ALC, ROUTINE (REF LAB)
Amphetamines, Urine: NEGATIVE ng/mL
Barbiturate, Ur: NEGATIVE ng/mL
Benzodiazepine Quant, Ur: NEGATIVE ng/mL
Cannabinoid Quant, Ur: NEGATIVE ng/mL
Cocaine (Metab.): NEGATIVE ng/mL
Ethanol U, Quan: NEGATIVE %
Methadone Screen, Urine: NEGATIVE ng/mL
Opiate Quant, Ur: NEGATIVE ng/mL
Phencyclidine, Ur: NEGATIVE ng/mL
Propoxyphene, Urine: NEGATIVE ng/mL

## 2022-09-15 LAB — HAPTOGLOBIN: Haptoglobin: 209 mg/dL (ref 23–355)

## 2022-09-15 MED ORDER — SORBITOL 70 % SOLN
960.0000 mL | TOPICAL_OIL | Freq: Once | ORAL | Status: AC
  Administered 2022-09-15: 960 mL via RECTAL
  Filled 2022-09-15: qty 240

## 2022-09-15 MED ORDER — POTASSIUM CHLORIDE CRYS ER 20 MEQ PO TBCR
40.0000 meq | EXTENDED_RELEASE_TABLET | Freq: Two times a day (BID) | ORAL | Status: AC
  Administered 2022-09-15 (×2): 40 meq via ORAL
  Filled 2022-09-15 (×2): qty 2

## 2022-09-15 MED ORDER — MELATONIN 3 MG PO TABS
3.0000 mg | ORAL_TABLET | Freq: Every day | ORAL | Status: DC
  Administered 2022-09-15: 3 mg via ORAL
  Filled 2022-09-15: qty 1

## 2022-09-15 NOTE — Progress Notes (Signed)
[facility] PROGRESS NOTE    Progress Note  Joshua Wilkins  JXB:[id number] DOB: 07/23/1970 DOA: 09/13/2022 PCP: Oneita Hurt, No     Brief Narrative:   Joshua Wilkins is an 52 y.o. male past medical history significant provide for self-inflicted wound to the face and a history of G-tube reports he was in his usual state until 2 weeks ago when he started having nausea and vomiting nonbloody.  Came into the ED found to be in acute kidney injury   Assessment/Plan:   AKI (acute kidney injury) (HCC) Likely prerenal azotemia in the setting of decreased oral intake and nausea and vomiting. Creatinine returned to baseline with IV fluids.  Hypercalcemia: Resolved with IV fluids.  Leukocytosis: Afebrile white count continues to improve.  Constipation: Only small bowel movement will give him a smog enema.  Normocytic anemia: With an MCV of 90 RDW of 16 B12 362. B12 was borderline low start repletion.  Nausea/Vomiting: Now improved    DVT prophylaxis: lovenox Family Communication:none Status is: Inpatient Remains inpatient appropriate because: Acute kidney injury    Code Status:     Code Status Orders  (From admission, onward)           Start     Ordered   09/13/22 1915  Full code  Continuous       Question:  By:  Answer:  Other   09/13/22 1915           Code Status History     This patient has a current code status but no historical code status.         IV Access:   Peripheral IV   Procedures and diagnostic studies:   CT ABDOMEN PELVIS WO CONTRAST  Result Date: 09/13/2022 CLINICAL DATA:  Two-week history of emesis EXAM: CT ABDOMEN AND PELVIS WITHOUT CONTRAST TECHNIQUE: Multidetector CT imaging of the abdomen and pelvis was performed following the standard protocol without IV contrast. RADIATION DOSE REDUCTION: This exam was performed according to the departmental dose-optimization program which includes automated exposure control, adjustment of the  mA and/or kV according to patient size and/or use of iterative reconstruction technique. COMPARISON:  CT abdomen and pelvis dated 06/16/2003 FINDINGS: Lower chest: No focal consolidation or pulmonary nodule in the lung bases. No pleural effusion or pneumothorax demonstrated. Partially imaged heart size is normal. Hepatobiliary: No focal hepatic lesions. No intra or extrahepatic biliary ductal dilation. Normal gallbladder. Pancreas: No focal lesions or main ductal dilation. Spleen: Normal in size without focal abnormality. Adrenals/Urinary Tract: No adrenal nodules. Duplex right kidney. No suspicious renal mass, calculi or hydronephrosis. No focal bladder wall thickening. Stomach/Bowel: Normal appearance of the stomach. No evidence of bowel wall thickening, distention, or inflammatory changes. Large volume stool within the rectum with moderate volume stool throughout the remainder of the colon. Normal appendix. Vascular/Lymphatic: Aortic atherosclerosis. No enlarged abdominal or pelvic lymph nodes. Reproductive: Prostate is unremarkable. Other: No free fluid, fluid collection, or free air. Musculoskeletal: No acute or abnormal lytic or blastic osseous lesions. Degenerative changes of the lumbar spine at L4-5 with grade 1 anterolisthesis. IMPRESSION: 1. No acute abnormality in the abdomen or pelvis. 2. Large volume stool within the rectum with moderate volume stool throughout the remainder of the colon. 3.  Aortic Atherosclerosis (ICD10-I70.0). Electronically Signed   By: Agustin Cree M.D.   On: 09/13/2022 15:29     Medical Consultants:   None.   Subjective:    Joshua Wilkins tolerated his diet had a small bowel  movement. Objective:    Vitals:   09/14/22 0620 09/14/22 1241 09/14/22 1919 09/15/22 0317  BP: 113/68 121/73 116/81 125/72  Pulse: 90 96 81 80  Resp: 20 18 17 19   Temp: 98.2 F (36.8 C) (!) 97.5 F (36.4 C) 98.2 F (36.8 C) 97.7 F (36.5 C)  TempSrc:      SpO2: 99% 100% 100% 100%   Weight:      Height:       SpO2: 100 %   Intake/Output Summary (Last 24 hours) at 09/15/2022 0955 Last data filed at 09/15/2022 0600 Gross per 24 hour  Intake 1984.14 ml  Output --  Net 1984.14 ml   Filed Weights   09/13/22 2004  Weight: 63.3 kg    Exam: General exam: In no acute distress. Respiratory system: Good air movement and clear to auscultation. Cardiovascular system: S1 & S2 heard, RRR. No JVD. Gastrointestinal system: Abdomen is nondistended, soft and nontender.  Extremities: No pedal edema. Skin: No rashes, lesions or ulcers Psychiatry: Judgement and insight appear normal. Mood & affect appropriate.  Data Reviewed:    Labs: Basic Metabolic Panel: Recent Labs  Lab 09/13/22 1354 09/13/22 1934 09/13/22 2150 09/14/22 0317 09/15/22 0313  NA 136  --  136 138 140  K 3.3*  --  3.5 3.7 3.4*  CL 95*  --  101 106 111  CO2 26  --  23 23 23   GLUCOSE 105*  --  100* 97 102*  BUN 55*  --  52* 48* 24*  CREATININE 2.77* 2.18* 2.22* 1.80* 0.88  CALCIUM 10.6*  --  9.4 9.2 8.7*  PHOS  --   --  2.4*  --   --    GFR Estimated Creatinine Clearance: 89.9 mL/min (by C-G formula based on SCr of 0.88 mg/dL). Liver Function Tests: Recent Labs  Lab 09/13/22 1354 09/13/22 2150  AST 12* 13*  ALT 18 20  ALKPHOS 67 62  BILITOT 0.7 0.9  PROT 8.1 7.6  ALBUMIN 4.9 4.2   Recent Labs  Lab 09/13/22 1354  LIPASE 24   No results for input(s): "AMMONIA" in the last 168 hours. Coagulation profile Recent Labs  Lab 09/14/22 0317  INR 1.2   COVID-19 Labs  Recent Labs    09/13/22 2150  FERRITIN 167  LDH 89*    Lab Results  Component Value Date   SARSCOV2NAA NEGATIVE 09/13/2022    CBC: Recent Labs  Lab 09/13/22 1354 09/13/22 1934 09/14/22 0317  WBC 13.5* 13.6* 11.0*  HGB 9.2* 8.3* 8.0*  HCT 26.8* 25.2* 23.8*  MCV 86.7 91.3 90.5  PLT 516* 477* 469*   Cardiac Enzymes: No results for input(s): "CKTOTAL", "CKMB", "CKMBINDEX", "TROPONINI" in the last 168  hours. BNP (last 3 results) No results for input(s): "PROBNP" in the last 8760 hours. CBG: No results for input(s): "GLUCAP" in the last 168 hours. D-Dimer: No results for input(s): "DDIMER" in the last 72 hours. Hgb A1c: No results for input(s): "HGBA1C" in the last 72 hours. Lipid Profile: No results for input(s): "CHOL", "HDL", "LDLCALC", "TRIG", "CHOLHDL", "LDLDIRECT" in the last 72 hours. Thyroid function studies: No results for input(s): "TSH", "T4TOTAL", "T3FREE", "THYROIDAB" in the last 72 hours.  Invalid input(s): "FREET3" Anemia work up: Recent Labs    09/13/22 2150  VITAMINB12 364  FOLATE 10.3  FERRITIN 167  TIBC 336  IRON 66  RETICCTPCT 1.6   Sepsis Labs: Recent Labs  Lab 09/13/22 1354 09/13/22 1934 09/14/22 0317  WBC 13.5* 13.6* 11.0*  Microbiology Recent Results (from the past 240 hour(s))  SARS Coronavirus 2 by RT PCR (hospital order, performed in [facility] hospital lab) *cepheid single result test* Anterior Nasal Swab     Status: None   Collection Time: 09/13/22  9:52 PM   Specimen: Anterior Nasal Swab  Result Value Ref Range Status   SARS Coronavirus 2 by RT PCR NEGATIVE NEGATIVE Final    Comment: (NOTE) SARS-CoV-2 target nucleic acids are NOT DETECTED.  The SARS-CoV-2 RNA is generally detectable in upper and lower respiratory specimens during the acute phase of infection. The lowest concentration of SARS-CoV-2 viral copies this assay can detect is 250 copies / mL. A negative result does not preclude SARS-CoV-2 infection and should not be used as the sole basis for treatment or other patient management decisions.  A negative result may occur with improper specimen collection / handling, submission of specimen other than nasopharyngeal swab, presence of viral mutation(s) within the areas targeted by this assay, and inadequate number of viral copies (<250 copies / mL). A negative result must be combined with clinical observations, patient  history, and epidemiological information.  Fact Sheet for Patients:   RoadLapTop.co.za  Fact Sheet for Healthcare Providers: http://kim-miller.com/  This test is not yet approved or  cleared by the [place] FDA and has been authorized for detection and/or diagnosis of SARS-CoV-2 by FDA under an Emergency Use Authorization (EUA).  This EUA will remain in effect (meaning this test can be used) for the duration of the COVID-19 declaration under Section 564(b)(1) of the Act, 21 U.S.C. section 360bbb-3(b)(1), unless the authorization is terminated or revoked sooner.  Performed at [facility], [address], [place], [place] [address]      Medications:    cyanocobalamin  1,000 mcg Subcutaneous Daily   enoxaparin (LOVENOX) injection  40 mg Subcutaneous Q24H   polyethylene glycol  17 g Oral BID   potassium chloride  40 mEq Oral BID   senna-docusate  2 tablet Oral BID   sodium chloride flush  3 mL Intravenous Q12H   Continuous Infusions:  promethazine (PHENERGAN) injection (IM or IVPB) 12.5 mg (09/14/22 2254)      LOS: 2 days   Marinda Elk  [facility]  09/15/2022, 9:55 AM

## 2022-09-15 NOTE — Plan of Care (Signed)

## 2022-09-16 DIAGNOSIS — N179 Acute kidney failure, unspecified: Secondary | ICD-10-CM | POA: Diagnosis not present

## 2022-09-16 MED ORDER — POLYETHYLENE GLYCOL 3350 17 G PO PACK: 17.0000 g | PACK | Freq: Two times a day (BID) | ORAL | Status: DC

## 2022-09-16 MED ORDER — SORBITOL 70 % SOLN
30.0000 mL | Freq: Once | Status: DC
  Filled 2022-09-16: qty 30
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
## 2022-09-17 LAB — PARATHYROID HORMONE, INTACT (NO CA): PTH: 17 pg/mL (ref 15–65)

## 2022-09-17 LAB — CALCIUM, IONIZED: Calcium, Ionized, Serum: 5.1 mg/dL (ref 4.5–5.6)

## 2022-09-19 LAB — PROTEIN ELECTROPHORESIS, SERUM
A/G Ratio: 1.2 (ref 0.7–1.7)
Albumin ELP: 3.8 g/dL (ref 2.9–4.4)
Alpha-1-Globulin: 0.3 g/dL (ref 0.0–0.4)
Alpha-2-Globulin: 0.8 g/dL (ref 0.4–1.0)
Beta Globulin: 1.1 g/dL (ref 0.7–1.3)
Gamma Globulin: 1.1 g/dL (ref 0.4–1.8)
Globulin, Total: 3.3 g/dL (ref 2.2–3.9)
Total Protein ELP: 7.1 g/dL (ref 6.0–8.5)

## 2022-10-31 ENCOUNTER — Emergency Department (HOSPITAL_COMMUNITY)
Admission: EM | Admit: 2022-10-31 | Discharge: 2022-11-23 | Disposition: E | Payer: Medicaid Other | Attending: Emergency Medicine | Admitting: Emergency Medicine

## 2022-10-31 DIAGNOSIS — S0993XA Unspecified injury of face, initial encounter: Secondary | ICD-10-CM | POA: Diagnosis present

## 2022-10-31 DIAGNOSIS — W3400XA Accidental discharge from unspecified firearms or gun, initial encounter: Secondary | ICD-10-CM | POA: Insufficient documentation

## 2022-10-31 DIAGNOSIS — S0180XA Unspecified open wound of other part of head, initial encounter: Secondary | ICD-10-CM | POA: Insufficient documentation

## 2022-10-31 MED ORDER — EPINEPHRINE 1 MG/10ML IJ SOSY
PREFILLED_SYRINGE | INTRAMUSCULAR | Status: AC | PRN
Start: 2022-10-31 — End: 2022-10-31
  Administered 2022-10-31 (×4): 1 mg via INTRAVENOUS

## 2022-10-31 MED ORDER — AMIODARONE IV BOLUS ONLY 150 MG/100ML
INTRAVENOUS | Status: AC | PRN
Start: 2022-10-31 — End: 2022-10-31
  Administered 2022-10-31: 150 mg via INTRAVENOUS

## 2022-10-31 MED ORDER — SODIUM BICARBONATE 8.4 % IV SOLN
INTRAVENOUS | Status: AC | PRN
Start: 2022-10-31 — End: 2022-10-31
  Administered 2022-10-31 (×3): 50 meq via INTRAVENOUS

## 2022-10-31 NOTE — Progress Notes (Signed)
Orthopedic Tech Progress Note Patient Details:  Joshua Wilkins 1970/09/08 [id number]  Patient ID: [name], male   DOB: 1970-05-08, 52 y.o.   MRN: [id number] I attended trauma page. Trinna Post 11/30/22, 5:30 PM
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
## 2022-10-31 NOTE — Progress Notes (Signed)
Responded to trauma page
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
## 2022-10-31 NOTE — Procedures (Signed)
   Procedure Note  Date: Nov 11, 2022  Procedure: central venous catheter placement--right, femoral vein, without ultrasound guidance  Pre-op diagnosis:  ACLS Post-op diagnosis: same  Surgeon: Diamantina Monks, MD  Anesthesia: local  EBL: <5cc Drains/Implants: single lumen central venous catheter  Description of procedure: This procedure was performed emergently, therefore informed consent was not obtained, and was performed under non-sterile conditions. The right femoral  vein was localized using anatomic landmarks, accessed using an introducer needle, and a guidewire passed through the needle. The needle was removed and a skin nick was made. The tract was dilated and the central venous catheter advanced over the guidewire followed by removal of the guidewire. All ports drew blood easily and all were flushed with saline. The catheter was secured to the skin with suture.   Diamantina Monks, MD General and Trauma Surgery [facility] Surgery
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
## 2022-11-01 LAB — BPAM RBC
Blood Product Expiration Date: 202411112359
Blood Product Expiration Date: 202411112359
ISSUE DATE / TIME: 202410091604
ISSUE DATE / TIME: 202410091604
ISSUING PHYSICIAN: 202410091604
Unit Type and Rh: 202411112359
Unit Type and Rh: 5100
Unit Type and Rh: 5100

## 2022-11-01 LAB — TYPE AND SCREEN
Unit division: 0
Unit division: 0

## 2022-11-23 DEATH — deceased
